# Patient Record
Sex: Female | Born: 1965 | Marital: Single | Smoking: Never smoker
Health system: Southern US, Community
[De-identification: ages and names within clinical notes are randomized; demographics above are authoritative.]

## PROBLEM LIST (undated history)

## (undated) DIAGNOSIS — N809 Endometriosis, unspecified: Secondary | ICD-10-CM

## (undated) DIAGNOSIS — F419 Anxiety disorder, unspecified: Secondary | ICD-10-CM

## (undated) DIAGNOSIS — Q211 Atrial septal defect: Secondary | ICD-10-CM

## (undated) DIAGNOSIS — G43909 Migraine, unspecified, not intractable, without status migrainosus: Secondary | ICD-10-CM

## (undated) DIAGNOSIS — E079 Disorder of thyroid, unspecified: Secondary | ICD-10-CM

## (undated) DIAGNOSIS — D219 Benign neoplasm of connective and other soft tissue, unspecified: Secondary | ICD-10-CM

## (undated) DIAGNOSIS — I341 Nonrheumatic mitral (valve) prolapse: Secondary | ICD-10-CM

## (undated) HISTORY — PX: ENDOMETRIAL ABLATION: SHX621

## (undated) HISTORY — DX: Endometriosis, unspecified: N80.9

## (undated) HISTORY — DX: Anxiety disorder, unspecified: F41.9

## (undated) HISTORY — PX: HYSTEROSCOPY: SHX211

## (undated) HISTORY — DX: Migraine, unspecified, not intractable, without status migrainosus: G43.909

## (undated) HISTORY — DX: Benign neoplasm of connective and other soft tissue, unspecified: D21.9

## (undated) HISTORY — DX: Nonrheumatic mitral (valve) prolapse: I34.1

## (undated) HISTORY — DX: Disorder of thyroid, unspecified: E07.9

## (undated) HISTORY — DX: Atrial septal defect: Q21.1

## (undated) HISTORY — PX: PELVIC LAPAROSCOPY: SHX162

---
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
## 2005-07-20 DIAGNOSIS — E079 Disorder of thyroid, unspecified: Secondary | ICD-10-CM

## 2005-07-20 HISTORY — DX: Disorder of thyroid, unspecified: E07.9

## 2005-07-20 HISTORY — PX: LOBECTOMY: SHX5089

## 2012-07-20 HISTORY — PX: UTERINE FIBROID SURGERY: SHX826

## 2012-07-20 HISTORY — PX: LAPAROTOMY: SHX154

## 2013-07-20 DIAGNOSIS — Q2112 Patent foramen ovale: Secondary | ICD-10-CM

## 2013-07-20 DIAGNOSIS — Q211 Atrial septal defect: Secondary | ICD-10-CM

## 2013-07-20 HISTORY — DX: Patent foramen ovale: Q21.12

## 2013-07-20 HISTORY — DX: Atrial septal defect: Q21.1

## 2014-05-07 ENCOUNTER — Ambulatory Visit (INDEPENDENT_AMBULATORY_CARE_PROVIDER_SITE_OTHER): Payer: BC Managed Care – PPO | Admitting: Internal Medicine

## 2014-05-07 ENCOUNTER — Encounter: Payer: Self-pay | Admitting: *Deleted

## 2014-05-07 ENCOUNTER — Encounter: Payer: Self-pay | Admitting: Internal Medicine

## 2014-05-07 VITALS — BP 109/70 | HR 77 | Temp 97.9°F | Resp 16 | Ht 60.0 in | Wt 145.0 lb

## 2014-05-07 DIAGNOSIS — R51 Headache: Secondary | ICD-10-CM

## 2014-05-07 DIAGNOSIS — Q2112 Patent foramen ovale: Secondary | ICD-10-CM | POA: Insufficient documentation

## 2014-05-07 DIAGNOSIS — I341 Nonrheumatic mitral (valve) prolapse: Secondary | ICD-10-CM | POA: Diagnosis not present

## 2014-05-07 DIAGNOSIS — Q211 Atrial septal defect: Secondary | ICD-10-CM | POA: Diagnosis not present

## 2014-05-07 DIAGNOSIS — M542 Cervicalgia: Secondary | ICD-10-CM | POA: Diagnosis not present

## 2014-05-07 DIAGNOSIS — G4700 Insomnia, unspecified: Secondary | ICD-10-CM

## 2014-05-07 DIAGNOSIS — E042 Nontoxic multinodular goiter: Secondary | ICD-10-CM

## 2014-05-07 DIAGNOSIS — R519 Headache, unspecified: Secondary | ICD-10-CM | POA: Insufficient documentation

## 2014-05-07 DIAGNOSIS — N809 Endometriosis, unspecified: Secondary | ICD-10-CM

## 2014-05-07 LAB — COMPREHENSIVE METABOLIC PANEL
ALBUMIN: 4.2 g/dL (ref 3.5–5.2)
ALK PHOS: 60 U/L (ref 39–117)
ALT: 15 U/L (ref 0–35)
AST: 18 U/L (ref 0–37)
BILIRUBIN TOTAL: 0.5 mg/dL (ref 0.2–1.2)
BUN: 13 mg/dL (ref 6–23)
CO2: 33 mEq/L — ABNORMAL HIGH (ref 19–32)
Calcium: 9.5 mg/dL (ref 8.4–10.5)
Chloride: 101 mEq/L (ref 96–112)
Creat: 0.85 mg/dL (ref 0.50–1.10)
GLUCOSE: 92 mg/dL (ref 70–99)
Potassium: 4.6 mEq/L (ref 3.5–5.3)
SODIUM: 140 meq/L (ref 135–145)
TOTAL PROTEIN: 6.5 g/dL (ref 6.0–8.3)

## 2014-05-07 LAB — CBC WITH DIFFERENTIAL/PLATELET
BASOS PCT: 1 % (ref 0–1)
Basophils Absolute: 0.1 10*3/uL (ref 0.0–0.1)
Eosinophils Absolute: 0.3 10*3/uL (ref 0.0–0.7)
Eosinophils Relative: 3 % (ref 0–5)
HCT: 41.6 % (ref 36.0–46.0)
HEMOGLOBIN: 14.2 g/dL (ref 12.0–15.0)
Lymphocytes Relative: 30 % (ref 12–46)
Lymphs Abs: 3 10*3/uL (ref 0.7–4.0)
MCH: 31.7 pg (ref 26.0–34.0)
MCHC: 34.1 g/dL (ref 30.0–36.0)
MCV: 92.9 fL (ref 78.0–100.0)
MONOS PCT: 7 % (ref 3–12)
Monocytes Absolute: 0.7 10*3/uL (ref 0.1–1.0)
NEUTROS PCT: 59 % (ref 43–77)
Neutro Abs: 6 10*3/uL (ref 1.7–7.7)
PLATELETS: 263 10*3/uL (ref 150–400)
RBC: 4.48 MIL/uL (ref 3.87–5.11)
RDW: 12.5 % (ref 11.5–15.5)
WBC: 10.1 10*3/uL (ref 4.0–10.5)

## 2014-05-07 LAB — LIPID PANEL
Cholesterol: 194 mg/dL (ref 0–200)
HDL: 59 mg/dL (ref >39)
LDL CALC: 72 mg/dL (ref 0–99)
TRIGLYCERIDES: 315 mg/dL — AB (ref <150)
Total CHOL/HDL Ratio: 3.3 Ratio
VLDL: 63 mg/dL — AB (ref 0–40)

## 2014-05-07 LAB — TSH: TSH: 1.288 u[IU]/mL (ref 0.350–4.500)

## 2014-05-07 MED ORDER — METAXALONE 800 MG PO TABS: ORAL_TABLET | ORAL | Status: DC

## 2014-05-07 MED ORDER — NABUMETONE 500 MG PO TABS: ORAL_TABLET | ORAL | Status: DC

## 2014-05-07 NOTE — Patient Instructions (Signed)
Will set up referral to Dr. Gwenlyn Found '  Will set up referral to Dr. Niel Hummer for chronic neck pain   To xray and pharmacy today
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
## 2014-05-08 ENCOUNTER — Ambulatory Visit (HOSPITAL_BASED_OUTPATIENT_CLINIC_OR_DEPARTMENT_OTHER): Payer: BC Managed Care – PPO

## 2014-05-08 ENCOUNTER — Ambulatory Visit (HOSPITAL_BASED_OUTPATIENT_CLINIC_OR_DEPARTMENT_OTHER)
Admission: RE | Admit: 2014-05-08 | Discharge: 2014-05-08 | Disposition: A | Discharge status: Home / Self Care | Payer: BC Managed Care – PPO | Source: Ambulatory Visit | Attending: Internal Medicine | Admitting: Internal Medicine

## 2014-05-08 ENCOUNTER — Other Ambulatory Visit: Payer: Self-pay | Admitting: Internal Medicine

## 2014-05-08 DIAGNOSIS — M542 Cervicalgia: Secondary | ICD-10-CM | POA: Diagnosis not present

## 2014-05-08 LAB — VITAMIN D 25 HYDROXY (VIT D DEFICIENCY, FRACTURES): Vit D, 25-Hydroxy: 39 ng/mL (ref 30–89)

## 2014-05-09 NOTE — Progress Notes (Signed)
I spoke with Madeline Morales and gave the CT results. She will call Dr. Anselmo Rod office and make an appointment. -eh

## 2014-05-14 ENCOUNTER — Encounter: Payer: Self-pay | Admitting: *Deleted

## 2014-05-14 ENCOUNTER — Other Ambulatory Visit: Payer: Self-pay | Admitting: *Deleted

## 2014-06-13 ENCOUNTER — Ambulatory Visit: Payer: BC Managed Care – PPO | Admitting: Cardiovascular Disease

## 2014-06-19 ENCOUNTER — Ambulatory Visit: Payer: BC Managed Care – PPO | Admitting: Cardiovascular Disease

## 2014-07-25 ENCOUNTER — Encounter: Payer: BC Managed Care – PPO | Admitting: Internal Medicine

## 2014-07-25 NOTE — Progress Notes (Signed)
Subjective:    Patient ID: Madeline Morales, female    DOB: 02/20/1966, 49 y.o.   MRN: [id number]  HPI 10/19 note Neck pain will start with CT and refer to Dr. Niel Hummer for non-narcotic pain mamangement RX relafen bid and skelaxin 800 mg hs   MVP/PFO: Would like to establish with cardiologist here. She is wondering about closure of PFO. Will set up consult with Dr. Gwenlyn Found  Migraine H/a Will need Dr. Kirstie Mirza records  MNG: Has upcoming appt with endocrinologist at [facility] She is on no suppementation at this point  Endometriosis S/p fibroidectomy in [place]  Insomnia     TODAY  Allergies  Allergen Reactions  . Sulfa Antibiotics Nausea Only  . Codeine Itching  . Penicillin G Hives   Past Medical History  Diagnosis Date  . Migraines   . Thyroid disease   . Mitral valve prolapse 1985  . PFO (patent foramen ovale) 2015   Past Surgical History  Procedure Laterality Date  . Lobectomy  2007    Thyroid   . Uterine fibroid surgery  2014   History   Social History  . Marital Status: Single    Spouse Name: N/A    Number of Children: N/A  . Years of Education: N/A   Occupational History  . Not on file.   Social History Main Topics  . Smoking status: Never Smoker   . Smokeless tobacco: Never Used  . Alcohol Use: Yes     Comment: rarely   . Drug Use: No  . Sexual Activity: Not Currently   Other Topics Concern  . Not on file   Social History Narrative  . No narrative on file   Family History  Problem Relation Age of Onset  . Pancreatic cancer Mother   . Breast cancer Maternal Aunt    Patient Active Problem List   Diagnosis Date Noted  . Headache  possible migraine 05/07/2014  . MVP (mitral valve prolapse) 05/07/2014  . Patent foramen ovale 05/07/2014  . Neck pain on left side 05/07/2014  . Endometriosis 05/07/2014  . Insomnia 05/07/2014  . Multinodular goiter  S/P left lobectomy 05/07/2014   Current Outpatient Prescriptions on File Prior to  Visit  Medication Sig Dispense Refill  . aspirin EC 81 MG tablet Take by mouth.    . metaxalone (SKELAXIN) 800 MG tablet Take one at HS daily 30 tablet 1  . Multiple Vitamins-Minerals (MULTIVITAMIN WITH MINERALS) tablet Take 1 tablet by mouth daily.    . nabumetone (RELAFEN) 500 MG tablet Take two tablets for one dose then one bid 60 tablet 1  . temazepam (RESTORIL) 7.5 MG capsule Take by mouth.     No current facility-administered medications on file prior to visit.       Review of Systems See HPI    Objective:   Physical Exam  Physical Exam  Nursing note and vitals reviewed.  Constitutional: She is oriented to person, place, and time. She appears well-developed and well-nourished.  HENT:  Head: Normocephalic and atraumatic.  Cardiovascular: Normal rate and regular rhythm. Exam reveals no gallop and no friction rub.  No murmur heard.  Pulmonary/Chest: Breath sounds normal. She has no wheezes. She has no rales.  Neurological: She is alert and oriented to person, place, and time.  Skin: Skin is warm and dry.  Psychiatric: She has a normal mood and affect. Her behavior is normal.             Assessment & Plan:

## 2014-08-21 ENCOUNTER — Ambulatory Visit: Payer: Self-pay | Admitting: Internal Medicine

## 2014-08-27 ENCOUNTER — Encounter: Payer: Self-pay | Admitting: Internal Medicine

## 2014-08-27 NOTE — Progress Notes (Signed)
   Subjective:    Patient ID: Madeline Morales, female    DOB: March 11, 1966, 49 y.o.   MRN: [id number]  HPI 04/2014 note Neck pain will start with CT and refer to Dr. Niel Hummer for non-narcotic pain mamangement RX relafen bid and skelaxin 800 mg hs   MVP/PFO: Would like to establish with cardiologist here. She is wondering about closure of PFO. Will set up consult with Dr. Gwenlyn Found  Migraine H/a Will need Dr. Kirstie Mirza records  MNG: Has upcoming appt with endocrinologist at [facility] She is on no suppementation at this point  Endometriosis S/p fibroidectomy in [place] is here for CPE  HM  Allergies  Allergen Reactions  . Sulfa Antibiotics Nausea Only  . Codeine Itching  . Penicillin G Hives   Past Medical History  Diagnosis Date  . Migraines   . Thyroid disease   . Mitral valve prolapse 1985  . PFO (patent foramen ovale) 2015   Past Surgical History  Procedure Laterality Date  . Lobectomy  2007    Thyroid   . Uterine fibroid surgery  2014   History   Social History  . Marital Status: Single    Spouse Name: N/A    Number of Children: N/A  . Years of Education: N/A   Occupational History  . Not on file.   Social History Main Topics  . Smoking status: Never Smoker   . Smokeless tobacco: Never Used  . Alcohol Use: Yes     Comment: rarely   . Drug Use: No  . Sexual Activity: Not Currently   Other Topics Concern  . Not on file   Social History Narrative  . No narrative on file   Family History  Problem Relation Age of Onset  . Pancreatic cancer Mother   . Breast cancer Maternal Aunt    Patient Active Problem List   Diagnosis Date Noted  . Headache  possible migraine 05/07/2014  . MVP (mitral valve prolapse) 05/07/2014  . Patent foramen ovale 05/07/2014  . Neck pain on left side 05/07/2014  . Endometriosis 05/07/2014  . Insomnia 05/07/2014  . Multinodular goiter  S/P left lobectomy 05/07/2014   Current Outpatient  Prescriptions on File Prior to Visit  Medication Sig Dispense Refill  . aspirin EC 81 MG tablet Take by mouth.    . metaxalone (SKELAXIN) 800 MG tablet Take one at HS daily 30 tablet 1  . Multiple Vitamins-Minerals (MULTIVITAMIN WITH MINERALS) tablet Take 1 tablet by mouth daily.    . nabumetone (RELAFEN) 500 MG tablet Take two tablets for one dose then one bid 60 tablet 1  . temazepam (RESTORIL) 7.5 MG capsule Take by mouth.     No current facility-administered medications on file prior to visit.       Review of Systems  Respiratory: Negative for cough, chest tightness, shortness of breath and wheezing.   Cardiovascular: Negative for chest pain, palpitations and leg swelling.  All other systems reviewed and are negative.      Objective:   Physical Exam        Assessment & Plan:

## 2014-11-05 ENCOUNTER — Other Ambulatory Visit: Payer: Self-pay | Admitting: Orthopedic Surgery

## 2014-11-05 DIAGNOSIS — M542 Cervicalgia: Secondary | ICD-10-CM

## 2014-11-16 ENCOUNTER — Other Ambulatory Visit: Payer: BC Managed Care – PPO

## 2015-03-04 ENCOUNTER — Other Ambulatory Visit: Payer: Self-pay | Admitting: Family Medicine

## 2015-03-04 ENCOUNTER — Ambulatory Visit
Admission: RE | Admit: 2015-03-04 | Discharge: 2015-03-04 | Disposition: A | Discharge status: Home / Self Care | Payer: BC Managed Care – PPO | Source: Ambulatory Visit | Attending: Family Medicine | Admitting: Family Medicine

## 2015-03-04 DIAGNOSIS — R109 Unspecified abdominal pain: Secondary | ICD-10-CM

## 2015-03-04 MED ORDER — IOPAMIDOL (ISOVUE-300) INJECTION 61%
100.0000 mL | Freq: Once | INTRAVENOUS | Status: AC | PRN
  Administered 2015-03-04: 100 mL via INTRAVENOUS

## 2016-07-08 ENCOUNTER — Ambulatory Visit (INDEPENDENT_AMBULATORY_CARE_PROVIDER_SITE_OTHER): Payer: BC Managed Care – PPO | Admitting: Obstetrics and Gynecology

## 2016-07-08 ENCOUNTER — Encounter: Payer: Self-pay | Admitting: Obstetrics and Gynecology

## 2016-07-08 VITALS — BP 118/74 | HR 76 | Resp 18 | Ht 61.0 in | Wt 151.0 lb

## 2016-07-08 DIAGNOSIS — R1032 Left lower quadrant pain: Secondary | ICD-10-CM | POA: Diagnosis not present

## 2016-07-08 DIAGNOSIS — N898 Other specified noninflammatory disorders of vagina: Secondary | ICD-10-CM | POA: Diagnosis not present

## 2016-07-08 DIAGNOSIS — N912 Amenorrhea, unspecified: Secondary | ICD-10-CM

## 2016-07-08 NOTE — Progress Notes (Signed)
GYNECOLOGY  VISIT   HPI: 50 y.o.   Single  Caucasian  female   No obstetric history on file. with Patient's last menstrual period was 08/23/2014 (exact date).   here for LLQ to mid pelvic pain for six months. Patient denies any change in bowel habits or fever.   Pain comes ago goes.  It occurs every month at least.  Lasts for a new days.  Sharp or achy. Feels like cramps most of the time. Nothing makes it better.  Miralax did not help make it better. Nothing makes it worse.  Does not take pain medication for this.  BMs are daily.  Had colonoscopy in 2016 which was normal.   Urination is not painful.  Voids a lot.   Hx of endometrial ablation and excision of a fibroid from the left side. Had laparoscopy and then conversion to laparotomy.  Also had tx of endometriosis at same time.  No menses one year following the surgery.  Using Replens for vaginal dryness.  Hot flashes have subsided.  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  GYNECOLOGIC HISTORY: Patient's last menstrual period was 08/23/2014 (exact date). Contraception:  Abstinence since surgery in 2014.  Had STD screening done then and it was negative.  Menopausal hormone therapy:  n/a Last mammogram:  07-07-16 normal at [facility] per patient Last pap smear:   04/2016 normal per patient--but states provider unable to obtain endocervical cells.        OB History    Gravida Para Term Preterm AB Living   1 0     1     SAB TAB Ectopic Multiple Live Births   1                 Patient Active Problem List   Diagnosis Date Noted  . Headache  possible migraine 05/07/2014  . MVP (mitral valve prolapse) 05/07/2014  . Patent foramen ovale 05/07/2014  . Neck pain on left side 05/07/2014  . Endometriosis 05/07/2014  . Insomnia 05/07/2014  . Multinodular goiter  S/P left lobectomy 05/07/2014    Past Medical History:  Diagnosis Date  . Anxiety   . Endometriosis   .  Fibroid   . Migraines   . Mitral valve prolapse 1985  . PFO (patent foramen ovale) 2015  . Thyroid disease 2007   Large nodule removed from Rt.thyroid    Past Surgical History:  Procedure Laterality Date  . ENDOMETRIAL ABLATION    . HYSTEROSCOPY    . LAPAROTOMY  2014   removal of large fibroid  . LOBECTOMY  2007   Thyroid   . PELVIC LAPAROSCOPY    . UTERINE FIBROID SURGERY  2014    Current Outpatient Prescriptions  Medication Sig Dispense Refill  . temazepam (RESTORIL) 15 MG capsule Take 1 capsule by mouth at bedtime.     No current facility-administered medications for this visit.      ALLERGIES: Sulfa antibiotics; Codeine; and Penicillin g  Family History  Problem Relation Age of Onset  . Pancreatic cancer Mother   . Cancer Mother   . Breast cancer Maternal Aunt 70  . Endometriosis Sister   . Endometriosis Sister     Social History   Social History  . Marital status: Single    Spouse name: N/A  . Number of children: N/A  . Years of education: N/A   Occupational History  . Not on file.   Social History Main Topics  .  Smoking status: Never Smoker  . Smokeless tobacco: Never Used  . Alcohol use Yes     Comment: rarely -- 1 to 2 drinks/month  . Drug use: No  . Sexual activity: No   Other Topics Concern  . Not on file   Social History Narrative  . No narrative on file    ROS:  Pertinent items are noted in HPI.  PHYSICAL EXAMINATION:    BP 118/74 (BP Location: Right Arm, Patient Position: Sitting, Cuff Size: Normal)   Pulse 76   Resp 18   Ht 5\' 1"  (1.549 m)   Wt 151 lb (68.5 kg)   LMP 08/23/2014 (Exact Date)   BMI 28.53 kg/m     General appearance: alert, cooperative and appears stated age Head: Normocephalic, without obvious abnormality, atraumatic.  Lungs: clear to auscultation bilaterally Heart: regular rate and rhythm Abdomen: soft, mild suprapubic and LLQ tenderness without guarding or rebound, no masses,  no organomegaly Extremities:  extremities normal, atraumatic, no cyanosis or edema Skin: Skin color, texture, turgor normal. No rashes or lesions.  No abnormal inguinal nodes palpated Neurologic: Grossly normal  Pelvic: External genitalia:  no lesions              Urethra:  normal appearing urethra with no masses, tenderness or lesions              Bartholins and Skenes: normal                 Vagina: normal appearing vagina with normal color and discharge, no lesions              Cervix: no lesions                Bimanual Exam:  Uterus:  normal size, contour, position, consistency, mobility, non-tender              Adnexa: no mass, fullness, tenderness              Rectal exam: Yes.  .  Confirms.              Anus:  normal sphincter tone, no lesions  Chaperone was present for exam.  ASSESSMENT  LLQ pain.  Amenorrhea. Status post ablation and myomectomy.  Vaginal dryness. Patent foramen ovale.   PLAN  Discussion of pain and possible etiologies - endometriosis, adhesive disease, ovulatory pain.  Bowel etiology less likely. Check FSH and E2.  Return for pelvic ultrasound.  Discussed cooking oils, water based lubricants, vitamin E suppositories, and vaginal estrogens for dryness. Would need approval from cardiology first if we decide to use vaginal estrogens.  An After Visit Summary was printed and given to the patient.  __30____ minutes face to face time of which over 50% was spent in counseling.

## 2016-07-09 ENCOUNTER — Encounter: Payer: Self-pay | Admitting: Obstetrics and Gynecology

## 2016-07-09 LAB — FOLLICLE STIMULATING HORMONE: FSH: 182.2 m[IU]/mL — ABNORMAL HIGH

## 2016-07-09 LAB — ESTRADIOL: ESTRADIOL: 19 pg/mL

## 2016-07-18 IMAGING — CT CT ABD-PELV W/ CM
2 of 5 series · 15 of 46 positions shown, 17 images · IV contrast (APPLIED)
Comparison: None.

CLINICAL DATA: Periodic left lower quadrant pain over the last 6
months, severe pain today, nausea, bright red blood in stool

EXAM:
CT ABDOMEN AND PELVIS WITH CONTRAST
TECHNIQUE: Multidetector CT imaging of the abdomen and pelvis was performed
using the standard protocol following bolus administration of
intravenous contrast.
CONTRAST:  100mL [id number] IOPAMIDOL ([id number]) INJECTION 61%

[Series 2: abd pelvis 5.0 i41s 1 · axial · 0.70mm/px · z∈[-475,-40]mm · 12 of 99 slices shown, 14 images]
[im 6/99  soft-tissue]
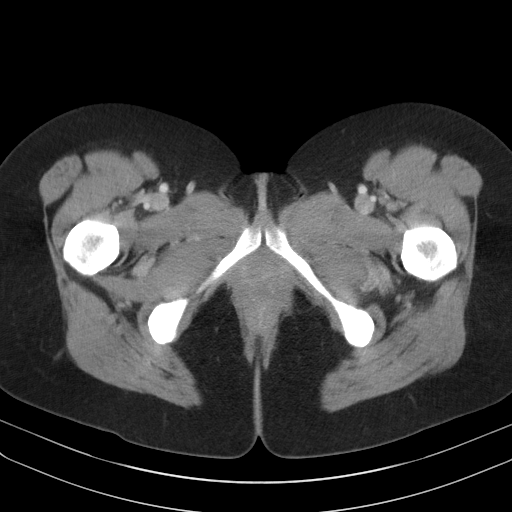
[im 6/99  bone]
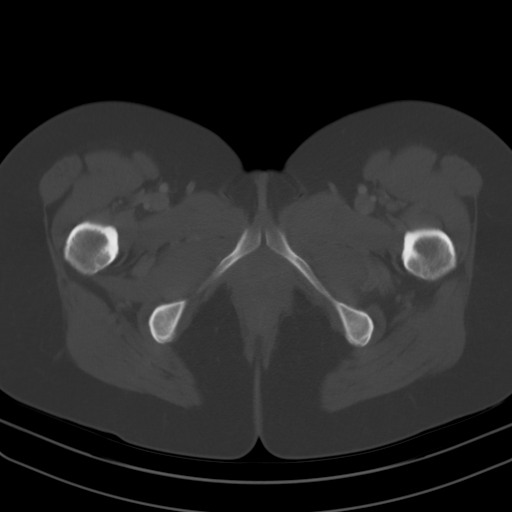
[im 16/99  soft-tissue]
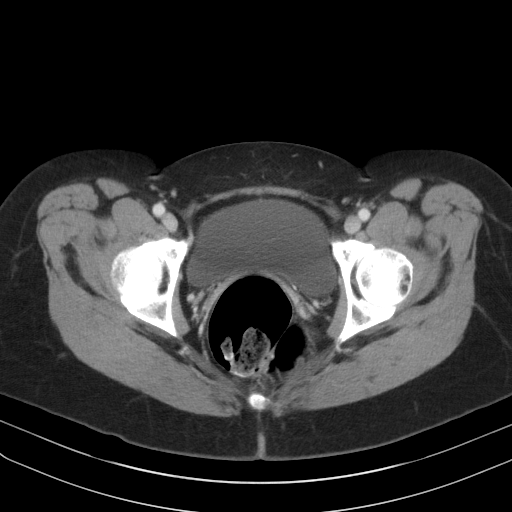
[im 21/99  soft-tissue]
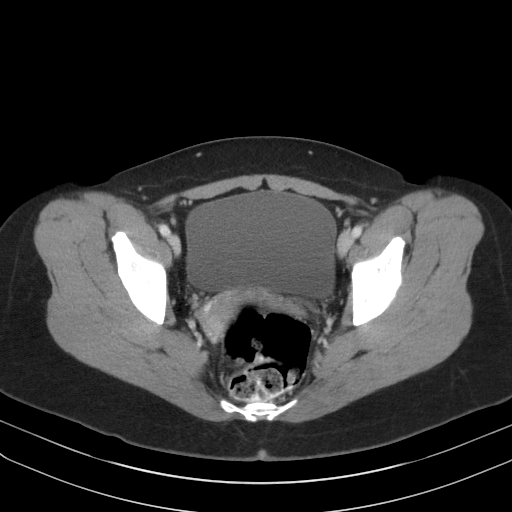
[im 31/99  soft-tissue]
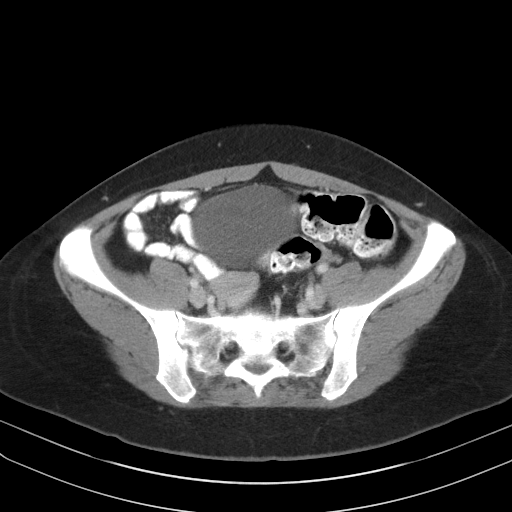
[im 37/99  soft-tissue]
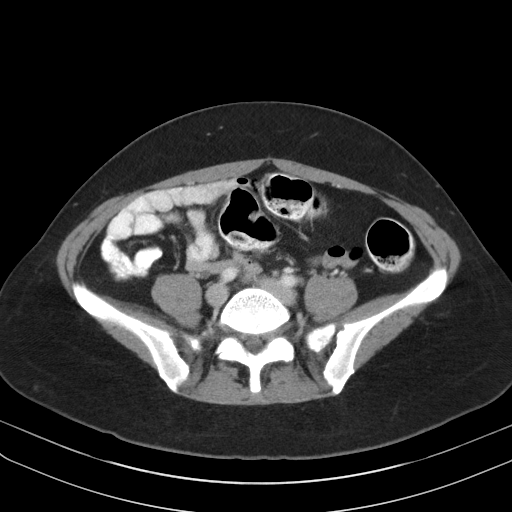
[im 47/99  soft-tissue]
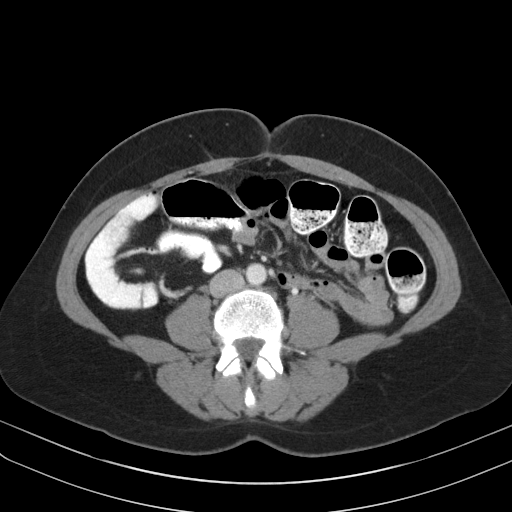
[im 52/99  soft-tissue]
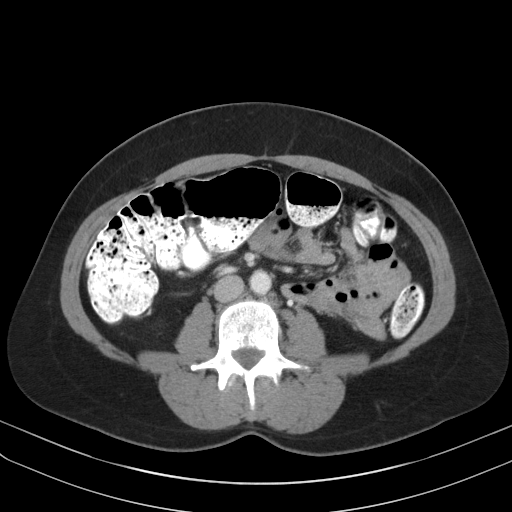
[im 62/99  soft-tissue]
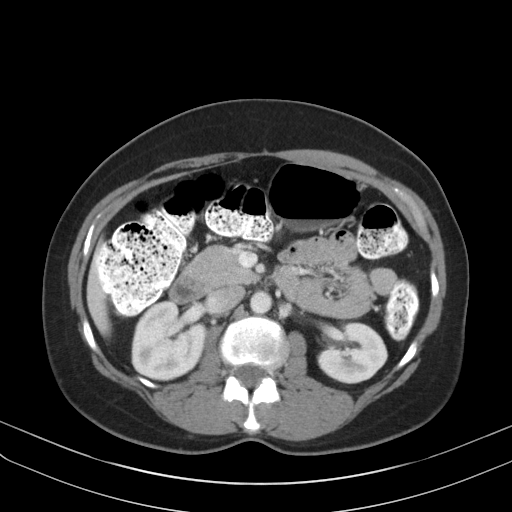
[im 68/99  soft-tissue]
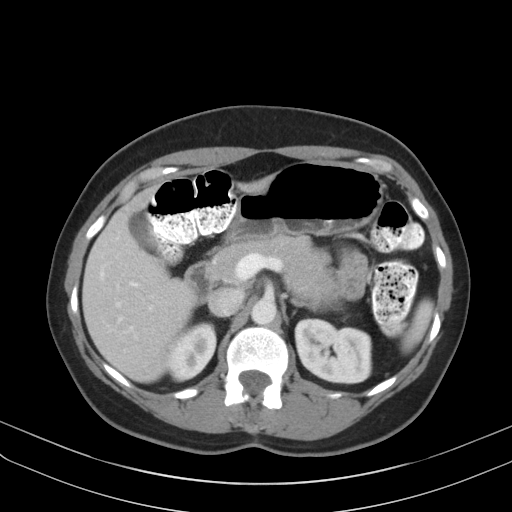
[im 68/99  bone]
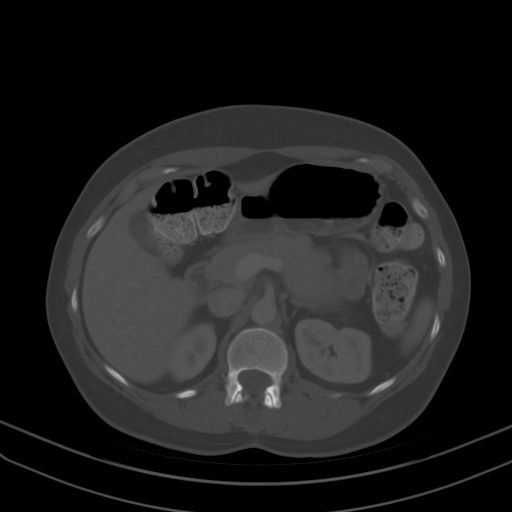
[im 78/99  soft-tissue]
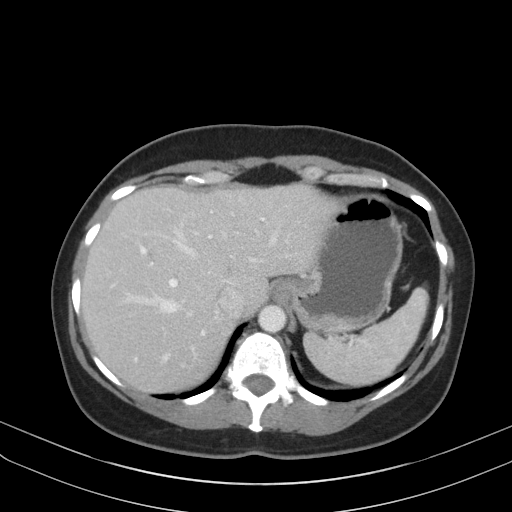
[im 83/99  soft-tissue]
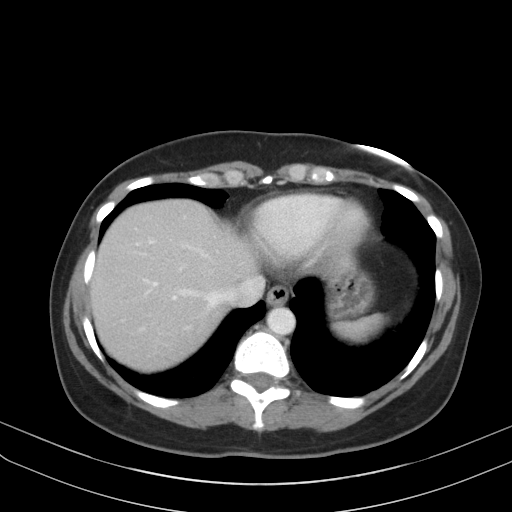
[im 93/99  soft-tissue]
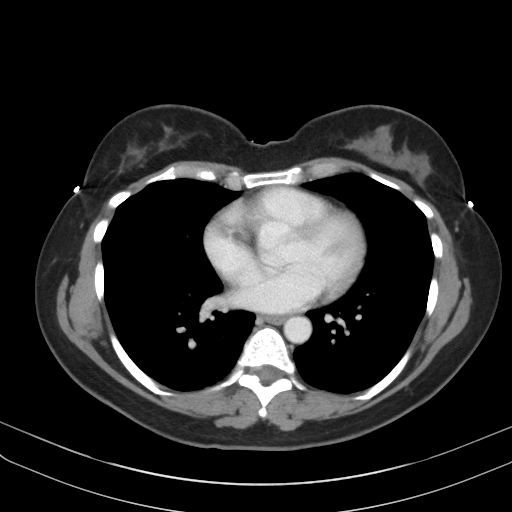

[Series 3: abd pelvis 3.0 spo cor · coronal · 0.66mm/px · 3 of 69 slices shown]
[im 23/69  soft-tissue]
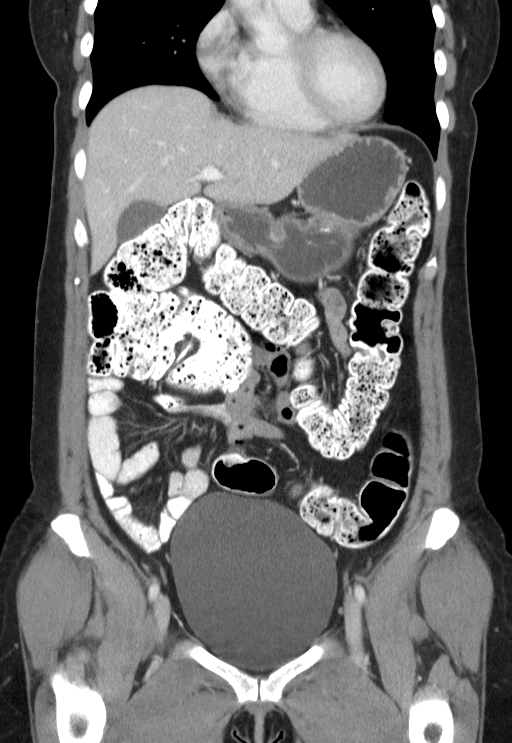
[im 31/69  soft-tissue]
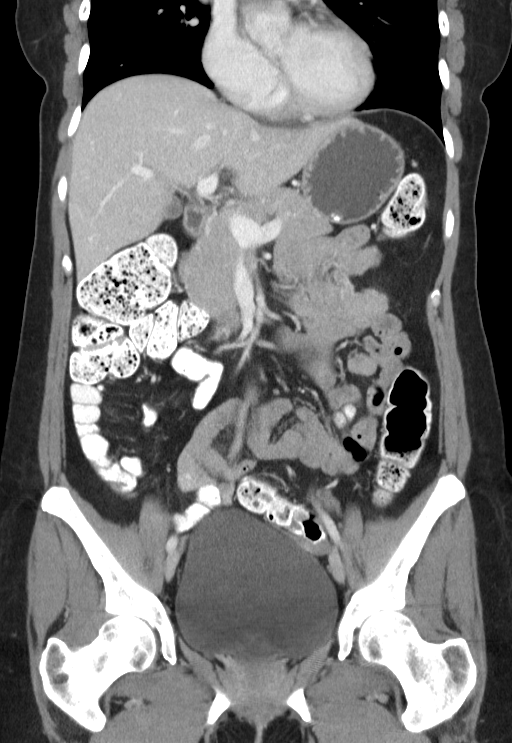
[im 38/69  soft-tissue]
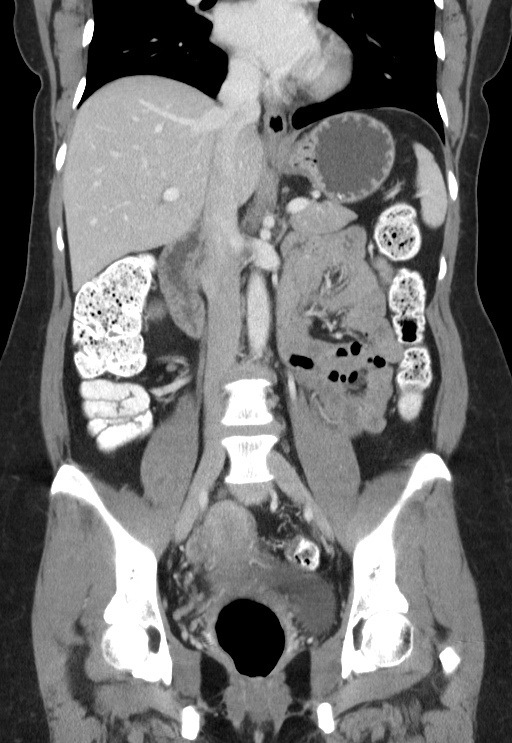

[15 of 46 positions shown; findings below may reference images not displayed]

FINDINGS: The lung bases are clear. The liver enhances with only tiny sub cm
low-attenuation structures in the right lobe consistent with cysts.
No calcified gallstones are noted. The pancreas is normal in size
and the pancreatic duct is not dilated. The adrenal glands and
spleen are unremarkable. The stomach is moderately fluid distended.
The kidneys enhance. No renal calculi are seen and there is no
evidence of hydronephrosis. On delayed images, the pelvocaliceal
systems are unremarkable and the proximal ureters are normal in
caliber. The abdominal aorta is normal in caliber.

The urinary bladder is moderately distended with no abnormality
noted. The uterus is normal in size. No adnexal lesion is seen. No
free fluid is noted within the pelvis no significant colonic
diverticula are seen and there is no evidence of diverticulitis.
Feces is noted throughout the colon. The terminal ileum and the
appendix are unremarkable. The lumbar vertebrae are in normal
alignment with normal intervertebral disc spaces.
IMPRESSION: 1. No explanation for the patient's left lower quadrant pain is
seen.
2. The appendix and terminal ileum are unremarkable.
3. No renal or ureteral calculi are noted.
4. No colonic diverticula are seen.

## 2016-07-23 ENCOUNTER — Other Ambulatory Visit: Payer: BC Managed Care – PPO

## 2016-07-23 ENCOUNTER — Other Ambulatory Visit: Payer: BC Managed Care – PPO | Admitting: Obstetrics and Gynecology

## 2016-07-23 ENCOUNTER — Ambulatory Visit (INDEPENDENT_AMBULATORY_CARE_PROVIDER_SITE_OTHER): Payer: BC Managed Care – PPO

## 2016-07-23 ENCOUNTER — Encounter: Payer: Self-pay | Admitting: Obstetrics and Gynecology

## 2016-07-23 ENCOUNTER — Ambulatory Visit (INDEPENDENT_AMBULATORY_CARE_PROVIDER_SITE_OTHER): Payer: BC Managed Care – PPO | Admitting: Obstetrics and Gynecology

## 2016-07-23 VITALS — BP 110/68 | HR 72 | Resp 16 | Wt 148.0 lb

## 2016-07-23 DIAGNOSIS — R1032 Left lower quadrant pain: Secondary | ICD-10-CM | POA: Diagnosis not present

## 2016-07-23 DIAGNOSIS — D259 Leiomyoma of uterus, unspecified: Secondary | ICD-10-CM | POA: Diagnosis not present

## 2016-07-23 NOTE — Progress Notes (Signed)
Patient ID: Madeline Morales, female   DOB: 30-Nov-1965, 51 y.o.   MRN: [id number] GYNECOLOGY  VISIT   HPI: 51 y.o.   Single  Caucasian  female   [id number] with Patient's last menstrual period was 08/23/2014 (exact date).   here for pelvic ultrasound for LLQ pain.   States it is not so severe.  Menopausal by [facility].   Hx endometrial ablation.   Hx of myomectomy for fibroid which was causing pain.  Very concerned that the ultrasound does not adequately represent the size of the fibroids.   Has hip and lower back pain. Has cervical stenosis of the spine.  No lower spine evaluation to date.  Normal colonoscopy in 2016.   Normal CT of abdomen and pelvis in 2016 when she also had LLQ pain.  Has had a stressful several months due to death of her mother.   GYNECOLOGIC HISTORY: Patient's last menstrual period was 08/23/2014 (exact date). Contraception:  Abstinence/Ablation Menopausal hormone therapy:  n/a Last mammogram:  07-07-16 normal at [facility] per patient Last pap smear:  04/2016  normal per patient--but states provider unable to obtain endocervical cells.          OB History    Gravida Para Term Preterm AB Living   1 0     1     SAB TAB Ectopic Multiple Live Births   1                 Patient Active Problem List   Diagnosis Date Noted  . Headache  possible migraine 05/07/2014  . MVP (mitral valve prolapse) 05/07/2014  . Patent foramen ovale 05/07/2014  . Neck pain on left side 05/07/2014  . Endometriosis 05/07/2014  . Insomnia 05/07/2014  . Multinodular goiter  S/P left lobectomy 05/07/2014    Past Medical History:  Diagnosis Date  . Anxiety   . Endometriosis   . Fibroid   . Migraines   . Mitral valve prolapse 1985  . PFO (patent foramen ovale) 2015  . Thyroid disease 2007   Large nodule removed from Rt.thyroid    Past Surgical History:  Procedure Laterality Date  . ENDOMETRIAL ABLATION    . HYSTEROSCOPY    . LAPAROTOMY  2014   removal of large fibroid  .  LOBECTOMY  2007   Thyroid   . PELVIC LAPAROSCOPY    . UTERINE FIBROID SURGERY  2014    Current Outpatient Prescriptions  Medication Sig Dispense Refill  . temazepam (RESTORIL) 15 MG capsule Take 1 capsule by mouth at bedtime.     No current facility-administered medications for this visit.      ALLERGIES: Sulfa antibiotics; Codeine; and Penicillin g  Family History  Problem Relation Age of Onset  . Pancreatic cancer Mother   . Cancer Mother   . Breast cancer Maternal Aunt 70  . Endometriosis Sister   . Endometriosis Sister     Social History   Social History  . Marital status: Single    Spouse name: N/A  . Number of children: N/A  . Years of education: N/A   Occupational History  . Not on file.   Social History Main Topics  . Smoking status: Never Smoker  . Smokeless tobacco: Never Used  . Alcohol use Yes     Comment: rarely -- 1 to 2 drinks/month  . Drug use: No  . Sexual activity: No   Other Topics Concern  . Not on file   Social History Narrative  . No narrative  on file    ROS:  Pertinent items are noted in HPI.  PHYSICAL EXAMINATION:    BP 110/68 (BP Location: Right Arm, Patient Position: Sitting, Cuff Size: Normal)   Pulse 72   Resp 16   Wt 148 lb (67.1 kg)   LMP 08/23/2014 (Exact Date)   BMI 27.96 kg/m     General appearance: alert, cooperative and appears stated age  Pelvic US -  Two 1 cm fibroids.  One small cystic area of myometrium.  Old endometriosis? EMS 2.29 mm.  Normal ovaries.  No free fluid.  ASSESSMENT  LLQ pain.  Amenorrhea due to menopause. Status post ablation and myomectomy.  Vaginal dryness. Patent foramen ovale.   PLAN  We discussed potential causes of pain - fibrosis from prior surgery or endometriosis, GI source, urinary tract, or musculoskeletal. We reviewed the limitations of ultrasound to identify fibrosis. She will treat for potential constipation.  If pain persists, she may need to see orthopedist for  evaluation of back and referred pain. If evaluation is negative and pain persists, laparoscopy to evaluate and treat endometriosis or even hysterectomy could be performed.  Patient is really focused on avoiding surgery at this time.   Follow up prn.  An After Visit Summary was printed and given to the patient.  _25_____ minutes face to face time of which over 50% was spent in counseling.

## 2016-07-23 NOTE — Patient Instructions (Signed)
Our plan is for you to log you pain pattern, use Miralax, increase your stretching and exercise, and focus on a healthy diet.   If you have continued pain, please return.   If you develop sudden increase in pain, nausea and vomiting, are not passing gas, or have your abdomen become distended, you need to seek care to rule out a bowel obstruction.

## 2016-07-24 ENCOUNTER — Encounter: Payer: Self-pay | Admitting: Obstetrics and Gynecology

## 2016-10-27 ENCOUNTER — Encounter: Payer: Self-pay | Admitting: Sports Medicine

## 2016-10-27 ENCOUNTER — Ambulatory Visit (INDEPENDENT_AMBULATORY_CARE_PROVIDER_SITE_OTHER): Payer: BC Managed Care – PPO | Admitting: Sports Medicine

## 2016-10-27 DIAGNOSIS — M79675 Pain in left toe(s): Secondary | ICD-10-CM | POA: Diagnosis not present

## 2016-10-27 DIAGNOSIS — L603 Nail dystrophy: Secondary | ICD-10-CM | POA: Diagnosis not present

## 2016-10-27 DIAGNOSIS — M79674 Pain in right toe(s): Secondary | ICD-10-CM

## 2016-10-27 NOTE — Progress Notes (Signed)
Subjective: Madeline Morales is a 51 y.o. female patient seen today for evaluation of toenails states after her last marathon the nails came off but then grew back and patient states that she is using OTC orthotics with no pain but is wondering if she needs custom orthotics. Patient has no other pedal complaints at this time.   Patient Active Problem List   Diagnosis Date Noted  . Headache  possible migraine 05/07/2014  . MVP (mitral valve prolapse) 05/07/2014  . Patent foramen ovale 05/07/2014  . Neck pain on left side 05/07/2014  . Endometriosis 05/07/2014  . Insomnia 05/07/2014  . Multinodular goiter  S/P left lobectomy 05/07/2014    Current Outpatient Prescriptions on File Prior to Visit  Medication Sig Dispense Refill  . temazepam (RESTORIL) 15 MG capsule Take 1 capsule by mouth at bedtime.     No current facility-administered medications on file prior to visit.     Allergies  Allergen Reactions  . Sulfa Antibiotics Nausea Only  . Adhesive [Tape]   . Codeine Itching  . Penicillin G Hives    Objective: Physical Exam  General: Well developed, nourished, no acute distress, awake, alert and oriented x 3  Vascular: Dorsalis pedis artery 2/4 bilateral, Posterior tibial artery 2/4 bilateral, skin temperature warm to warm proximal to distal bilateral lower extremities, no varicosities, pedal hair present bilateral.  Neurological: Gross sensation present via light touch bilateral.   Dermatological: Skin is warm, dry, and supple bilateral, Nails 1-10 are short, not thick, and not discolored with no subungal debris, no webspace macerations present bilateral, no open lesions present bilateral, no callus/corns/hyperkeratotic tissue present bilateral. No signs of infection bilateral.  Musculoskeletal: No symptomatic boney deformities noted bilateral. Mild 1st MTPJ limited motion on left. Muscular strength within normal limits without painon range of motion. No pain with calf compression  bilateral.  Assessment and Plan:  Problem List Items Addressed This Visit    None    Visit Diagnoses    Nail dystrophy    -  Primary   Toe pain, bilateral          -Examined patient.  -Discussed treatment options for traumatic shedding of nails  -Gave toe caps and advised patient that she would benefit from orthotics to help slow the progression of 1st MTPJ limitus on left with offloading -Patient desires to have coverage checked and if wants orthotics will schedule with Liliane Channel for casting; messaged Vickie to call to discuss orthotics benefits with patient  -Patient to return as needed  for follow up evaluation or sooner if symptoms worsen.  Landis Martins, DPM

## 2016-10-27 NOTE — Progress Notes (Signed)
   Subjective:    Patient ID: Madeline Morales, female    DOB: [birth year], 51 y.o.   MRN: [id number]  HPI  Chief Complaint  Patient presents with  . Toe Pain    BL; 2nd Toe; "feels sensitive" x 1 year. Denies X-rays.   . Foot Orthotics    Interested in orthotics, possibly.        Review of Systems     Objective:   Physical Exam        Assessment & Plan:

## 2016-10-28 ENCOUNTER — Other Ambulatory Visit: Payer: BC Managed Care – PPO

## 2016-11-19 ENCOUNTER — Other Ambulatory Visit: Payer: BC Managed Care – PPO
